# Patient Record
Sex: Male | Born: 2000
Health system: Southern US, Community
[De-identification: ages and names within clinical notes are randomized; demographics above are authoritative.]

## PROBLEM LIST (undated history)

## (undated) DIAGNOSIS — G43909 Migraine, unspecified, not intractable, without status migrainosus: Secondary | ICD-10-CM

## (undated) DIAGNOSIS — J45909 Unspecified asthma, uncomplicated: Secondary | ICD-10-CM

## (undated) HISTORY — PX: CIRCUMCISION: SUR203

---
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
## 2019-01-07 DIAGNOSIS — R809 Proteinuria, unspecified: Secondary | ICD-10-CM | POA: Diagnosis not present

## 2019-01-07 DIAGNOSIS — M549 Dorsalgia, unspecified: Secondary | ICD-10-CM | POA: Diagnosis not present

## 2022-03-21 ENCOUNTER — Emergency Department (HOSPITAL_BASED_OUTPATIENT_CLINIC_OR_DEPARTMENT_OTHER)
Admission: EM | Admit: 2022-03-21 | Discharge: 2022-03-21 | Disposition: A | Discharge status: Home / Self Care | Payer: Managed Care, Other (non HMO) | Attending: Emergency Medicine | Admitting: Emergency Medicine

## 2022-03-21 ENCOUNTER — Encounter (HOSPITAL_BASED_OUTPATIENT_CLINIC_OR_DEPARTMENT_OTHER): Payer: Self-pay | Admitting: Obstetrics and Gynecology

## 2022-03-21 ENCOUNTER — Emergency Department (HOSPITAL_BASED_OUTPATIENT_CLINIC_OR_DEPARTMENT_OTHER): Payer: Managed Care, Other (non HMO) | Admitting: Radiology

## 2022-03-21 ENCOUNTER — Other Ambulatory Visit: Payer: Self-pay

## 2022-03-21 DIAGNOSIS — Y9339 Activity, other involving climbing, rappelling and jumping off: Secondary | ICD-10-CM | POA: Diagnosis not present

## 2022-03-21 DIAGNOSIS — M25562 Pain in left knee: Secondary | ICD-10-CM

## 2022-03-21 DIAGNOSIS — X500XXA Overexertion from strenuous movement or load, initial encounter: Secondary | ICD-10-CM | POA: Insufficient documentation

## 2022-03-21 DIAGNOSIS — S8392XA Sprain of unspecified site of left knee, initial encounter: Secondary | ICD-10-CM | POA: Insufficient documentation

## 2022-03-21 DIAGNOSIS — S8992XA Unspecified injury of left lower leg, initial encounter: Secondary | ICD-10-CM | POA: Diagnosis present

## 2022-03-21 MED ORDER — IBUPROFEN 800 MG PO TABS: 800.0000 mg | ORAL_TABLET | Freq: Three times a day (TID) | ORAL | 0 refills | Status: AC

## 2022-03-21 MED ORDER — KETOROLAC TROMETHAMINE 15 MG/ML IJ SOLN
15.0000 mg | Freq: Once | INTRAMUSCULAR | Status: AC
  Administered 2022-03-21: 15 mg via INTRAMUSCULAR
  Filled 2022-03-21: qty 1

## 2022-03-21 NOTE — ED Triage Notes (Signed)
Patient reports he was fishing yesterday and there was a snake in his kayak and he jumped and fell out of the kayak and his knee twisted. Patient reports the swelling has worsened over the night and he is having increased pain.  ?

## 2022-03-21 NOTE — ED Provider Notes (Signed)
?[facility] EMERGENCY DEPT ?Provider Note ? ? ?CSN: [id number] ?Arrival date & time: 03/21/22  1354 ? ?  ? ?History ?Chief Complaint  ?Patient presents with  ? Knee Injury  ? ? ?Joshua Ward is a 21 y.o. male who presents to the emergency department with left knee pain that started yesterday.  Patient states he was fishing in his kayak when a snake came onto his kayak and he tried to escape jumping off the kayak and felt a twisting sensation on the knee.  He did not think anything of it initially but overnight his knee became increasing more painful and swollen.  Difficulty ambulating secondary to pain.  Denies any other additional injury, fever, chills. ? ?HPI ? ?  ? ?Home Medications ?Prior to Admission medications   ?Medication Sig Start Date End Date Taking? Authorizing Provider  ?ibuprofen (ADVIL) 800 MG tablet Take 1 tablet (800 mg total) by mouth 3 (three) times daily. 03/21/22  Yes Meredeth Ide, Shikara Mcauliffe M, PA-C  ?albuterol (PROVENTIL HFA;VENTOLIN HFA) 108 (90 BASE) MCG/ACT inhaler Inhale 2 puffs into the lungs every 6 (six) hours as needed for wheezing or shortness of breath.    [provider]  ?cyclobenzaprine (FLEXERIL) 10 MG tablet Take 1 tablet (10 mg total) by mouth 3 (three) times daily. 11/11/15   Niel Hummer, MD  ?diphenhydrAMINE (BENADRYL) 25 mg capsule Take 1 capsule (25 mg total) by mouth every 6 (six) hours as needed. 08/17/15   Margurite Auerbach, MD  ?imipramine (TOFRANIL) 25 MG tablet Take 25 mg by mouth at bedtime. 07/11/15   [provider]  ?montelukast (SINGULAIR) 10 MG tablet Take 10 mg by mouth every evening. 07/27/15   [provider]  ?naproxen (NAPROSYN) 500 MG tablet Take 1 tablet (500 mg total) by mouth 2 (two) times daily. 11/11/15   Niel Hummer, MD  ?   ? ?Allergies    ?Amoxicillin, Prednisone, Sulfamethizole, and Tamiflu [oseltamivir phosphate]   ? ?Review of Systems   ?Review of Systems  ?All other systems reviewed and are  negative. ? ?Physical Exam ?Updated Vital Signs ?BP (!) 151/89   Pulse 83   Temp 99.3 ?F (37.4 ?C)   Resp 17   Ht 6\' 2"  (1.88 m)   Wt 72.6 kg   SpO2 100%   BMI 20.54 kg/m?  ?Physical Exam ?Vitals and nursing note reviewed.  ?Constitutional:   ?   Appearance: Normal appearance.  ?HENT:  ?   Head: Normocephalic and atraumatic.  ?Eyes:  ?   General:     ?   Right eye: No discharge.     ?   Left eye: No discharge.  ?   Conjunctiva/sclera: Conjunctivae normal.  ?Pulmonary:  ?   Effort: Pulmonary effort is normal.  ?Musculoskeletal:  ?   Comments: Mild swelling and tenderness over the medial aspect of the left knee.  Valgus and varus testing negative.  He does have limited range of motion with flexion of the knee secondary to pain.  ?Skin: ?   General: Skin is warm and dry.  ?   Findings: No rash.  ?Neurological:  ?   General: No focal deficit present.  ?   Mental Status: He is alert.  ?Psychiatric:     ?   Mood and Affect: Mood normal.     ?   Behavior: Behavior normal.  ? ? ?ED Results / Procedures / Treatments   ?Labs ?(all labs ordered are listed, but only abnormal results are displayed) ?Labs  Reviewed - No data to display ? ?EKG ?None ? ?Radiology ?DG Knee Complete 4 Views Left ? ?Result Date: 03/21/2022 ?CLINICAL DATA:  Left knee injury from jumping out of kayak. Medial left knee pain. EXAM: LEFT KNEE - COMPLETE 4+ VIEW COMPARISON:  None Available. FINDINGS: No evidence of fracture, dislocation, or joint effusion. No evidence of arthropathy or other focal bone abnormality. Soft tissues are unremarkable. IMPRESSION: Negative. Electronically Signed   By: Danae Orleans M.D.   On: 03/21/2022 15:11   ? ?Procedures ?Procedures  ? ? ?Medications Ordered in ED ?Medications  ?ketorolac (TORADOL) 15 MG/ML injection 15 mg (15 mg Intramuscular Given 03/21/22 1607)  ? ? ?ED Course/ Medical Decision Making/ A&P ?  ?                        ?Medical Decision Making ?Joshua Ward is a 21 y.o. male who presents to the ED  for further evaluation of left-sided knee pain.  Eventual diagnosis includes knee sprain with mild meniscal or ligamentous injury.  I have a low suspicion for septic arthritis at this time.  Patient is otherwise young and healthy no acute distress.  We will give him a shot of Toradol for pain in addition to an Ace wrap for comfort.  I will give him a work note for tomorrow and have him follow-up with Ortho for further evaluation.  Strict turn precautions were discussed.  He is safe to discharge ? ? ?Amount and/or Complexity of Data Reviewed ?Radiology: ordered and independent interpretation performed. ?   Details: I ordered and personally interpreted image of the left knee.  No evidence of fractures or dislocations. ? ?Risk ?Prescription drug management. ?Decision regarding hospitalization. ? ? ?Final Clinical Impression(s) / ED Diagnoses ?Final diagnoses:  ?Acute pain of left knee  ? ? ?Rx / DC Orders ?ED Discharge Orders   ? ?      Ordered  ?  ibuprofen (ADVIL) 800 MG tablet  3 times daily       ? 03/21/22 1626  ? ?  ?  ? ?  ? ? ?  ?Teressa Lower, PA-C ?03/21/22 1626 ? ?  ?Ernie Avena, MD ?03/22/22 0118 ? ?

## 2022-03-21 NOTE — ED Notes (Signed)
RN provided AVS using Teachback Method. Patient verbalizes understanding of Discharge Instructions. Opportunity for Questioning and Answers were provided by RN. Patient Discharged from ED ambulatory to Home with Crutches via Self. ? ?

## 2022-03-21 NOTE — Discharge Instructions (Addendum)
Please follow-up with orthopedics or your primary care doctor in 1 week for further evaluation.  Please return to the emergency department for any worsening symptoms you might have. ?

## 2022-08-02 ENCOUNTER — Other Ambulatory Visit: Payer: Self-pay

## 2022-08-02 ENCOUNTER — Emergency Department (HOSPITAL_BASED_OUTPATIENT_CLINIC_OR_DEPARTMENT_OTHER): Payer: Managed Care, Other (non HMO)

## 2022-08-02 ENCOUNTER — Encounter (HOSPITAL_BASED_OUTPATIENT_CLINIC_OR_DEPARTMENT_OTHER): Payer: Self-pay

## 2022-08-02 ENCOUNTER — Emergency Department (HOSPITAL_BASED_OUTPATIENT_CLINIC_OR_DEPARTMENT_OTHER)
Admission: EM | Admit: 2022-08-02 | Discharge: 2022-08-02 | Disposition: A | Discharge status: Home / Self Care | Payer: Managed Care, Other (non HMO) | Attending: Emergency Medicine | Admitting: Emergency Medicine

## 2022-08-02 DIAGNOSIS — R1030 Lower abdominal pain, unspecified: Secondary | ICD-10-CM | POA: Diagnosis present

## 2022-08-02 DIAGNOSIS — K529 Noninfective gastroenteritis and colitis, unspecified: Secondary | ICD-10-CM | POA: Insufficient documentation

## 2022-08-02 LAB — COMPREHENSIVE METABOLIC PANEL
ALT: 12 U/L (ref 0–44)
AST: 17 U/L (ref 15–41)
Albumin: 4.5 g/dL (ref 3.5–5.0)
Alkaline Phosphatase: 63 U/L (ref 38–126)
Anion gap: 9 (ref 5–15)
BUN: 10 mg/dL (ref 6–20)
CO2: 24 mmol/L (ref 22–32)
Calcium: 9.3 mg/dL (ref 8.9–10.3)
Chloride: 106 mmol/L (ref 98–111)
Creatinine, Ser: 0.98 mg/dL (ref 0.61–1.24)
GFR, Estimated: >60 mL/min (ref >60)
Glucose, Bld: 92 mg/dL (ref 70–99)
Potassium: 3.8 mmol/L (ref 3.5–5.1)
Sodium: 139 mmol/L (ref 135–145)
Total Bilirubin: 0.3 mg/dL (ref 0.3–1.2)
Total Protein: 7.8 g/dL (ref 6.5–8.1)

## 2022-08-02 LAB — CBC
HCT: 38.8 % — ABNORMAL LOW (ref 39.0–52.0)
Hemoglobin: 13 g/dL (ref 13.0–17.0)
MCH: 24.3 pg — ABNORMAL LOW (ref 26.0–34.0)
MCHC: 33.5 g/dL (ref 30.0–36.0)
MCV: 72.4 fL — ABNORMAL LOW (ref 80.0–100.0)
Platelets: 191 10*3/uL (ref 150–400)
RBC: 5.36 MIL/uL (ref 4.22–5.81)
RDW: 14.6 % (ref 11.5–15.5)
WBC: 4.6 10*3/uL (ref 4.0–10.5)
nRBC: 0 % (ref 0.0–0.2)

## 2022-08-02 LAB — URINALYSIS, ROUTINE W REFLEX MICROSCOPIC
Bilirubin Urine: NEGATIVE
Glucose, UA: NEGATIVE mg/dL
Hgb urine dipstick: NEGATIVE
Ketones, ur: NEGATIVE mg/dL
Leukocytes,Ua: NEGATIVE
Nitrite: NEGATIVE
Specific Gravity, Urine: 1.021 (ref 1.005–1.030)
pH: 6 (ref 5.0–8.0)

## 2022-08-02 MED ORDER — MORPHINE SULFATE (PF) 2 MG/ML IV SOLN
2.0000 mg | Freq: Once | INTRAVENOUS | Status: AC
  Administered 2022-08-02: 2 mg via INTRAVENOUS
  Filled 2022-08-02: qty 1

## 2022-08-02 MED ORDER — LACTATED RINGERS IV BOLUS
1000.0000 mL | Freq: Once | INTRAVENOUS | Status: AC
Start: 2022-08-02 — End: 2022-08-02
  Administered 2022-08-02: 1000 mL via INTRAVENOUS

## 2022-08-02 MED ORDER — ONDANSETRON HCL 8 MG PO TABS: 8.0000 mg | ORAL_TABLET | Freq: Three times a day (TID) | ORAL | 0 refills | Status: AC | PRN

## 2022-08-02 MED ORDER — IOHEXOL 300 MG/ML  SOLN
100.0000 mL | Freq: Once | INTRAMUSCULAR | Status: AC | PRN
  Administered 2022-08-02: 85 mL via INTRAVENOUS

## 2022-08-02 NOTE — ED Triage Notes (Signed)
Pt states that he has been having lower abd pain since yesterday with n/v/d, denies fevers.

## 2022-08-02 NOTE — Discharge Instructions (Addendum)
Please drink plenty of fluids. A prescription for nausea medicine has been called to your pharmacy There is no evidence of acute intra-abdominal abnormality such as appendicitis.  Please return to the emergency department if you are having ongoing or worsening abdominal pain. Follow-up with your doctor in the next 1 to 2 weeks

## 2022-08-02 NOTE — ED Notes (Signed)
Patient transported to CT 

## 2022-08-02 NOTE — ED Provider Notes (Signed)
[facility] Provider Note   CSN: [id number] Arrival date & time: 08/02/22  [id number]     History  Chief Complaint  Patient presents with   Abdominal Pain    Joshua Ward is a 21 y.o. male.  HPI 21 year old male with past medical history presents today complaining of crampy lower abdominal pain.  He states it began yesterday.  He had multiple episodes of loose stool.  He did not note any blood or mucus.  He had 1 episode of vomiting.  He reports that a family member had some similar symptoms.  It is in his lower abdomen and it is crampy.  He has had no fever or chills.  He has not had any vomiting or diarrhea today.     Home Medications Prior to Admission medications   Medication Sig Start Date End Date Taking? Authorizing Provider  ondansetron (ZOFRAN) 8 MG tablet Take 1 tablet (8 mg total) by mouth every 8 (eight) hours as needed for nausea or vomiting. 08/02/22  Yes Pattricia Boss, MD  albuterol (PROVENTIL HFA;VENTOLIN HFA) 108 (90 BASE) MCG/ACT inhaler Inhale 2 puffs into the lungs every 6 (six) hours as needed for wheezing or shortness of breath.    [provider]  cyclobenzaprine (FLEXERIL) 10 MG tablet Take 1 tablet (10 mg total) by mouth 3 (three) times daily. 11/11/15   Louanne Skye, MD  diphenhydrAMINE (BENADRYL) 25 mg capsule Take 1 capsule (25 mg total) by mouth every 6 (six) hours as needed. 08/17/15   Rocky Link, MD  ibuprofen (ADVIL) 800 MG tablet Take 1 tablet (800 mg total) by mouth 3 (three) times daily. 03/21/22   Myna Bright M, PA-C  imipramine (TOFRANIL) 25 MG tablet Take 25 mg by mouth at bedtime. 07/11/15   [provider]  montelukast (SINGULAIR) 10 MG tablet Take 10 mg by mouth every evening. 07/27/15   [provider]  naproxen (NAPROSYN) 500 MG tablet Take 1 tablet (500 mg total) by mouth 2 (two) times daily. 11/11/15   Louanne Skye, MD      Allergies    Amoxicillin, Prednisone, Sulfamethizole,  and Tamiflu [oseltamivir phosphate]    Review of Systems   Review of Systems  Physical Exam Updated Vital Signs BP (!) 123/105   Pulse (!) 58   Temp 97.9 F (36.6 C) (Oral)   Resp 16   Ht 1.88 m (6\' 2" )   Wt 72.6 kg   SpO2 100%   BMI 20.55 kg/m  Physical Exam Vitals and nursing note reviewed.  Constitutional:      Appearance: He is well-developed.  HENT:     Head: Normocephalic and atraumatic.     Right Ear: External ear normal.     Left Ear: External ear normal.     Nose: Nose normal.  Eyes:     Extraocular Movements: Extraocular movements intact.  Neck:     Trachea: No tracheal deviation.  Pulmonary:     Effort: Pulmonary effort is normal.  Abdominal:     General: Abdomen is flat. Bowel sounds are normal.     Palpations: Abdomen is soft.     Tenderness: There is abdominal tenderness in the right lower quadrant, suprapubic area and left lower quadrant.  Musculoskeletal:        General: Normal range of motion.  Skin:    General: Skin is warm and dry.  Neurological:     Mental Status: He is alert and oriented to person, place, and time.  Psychiatric:        Mood and Affect: Mood normal.        Behavior: Behavior normal.     ED Results / Procedures / Treatments   Labs (all labs ordered are listed, but only abnormal results are displayed) Labs Reviewed  CBC - Abnormal; Notable for the following components:      Result Value   HCT 38.8 (*)    MCV 72.4 (*)    MCH 24.3 (*)    All other components within normal limits  URINALYSIS, ROUTINE W REFLEX MICROSCOPIC - Abnormal; Notable for the following components:   Protein, ur TRACE (*)    All other components within normal limits  COMPREHENSIVE METABOLIC PANEL    EKG None  Radiology CT ABDOMEN PELVIS W CONTRAST  Result Date: 08/02/2022 CLINICAL DATA:  21 year old male with left lower quadrant abdominal pain since yesterday. EXAM: CT ABDOMEN AND PELVIS WITH CONTRAST TECHNIQUE: Multidetector CT imaging of the  abdomen and pelvis was performed using the standard protocol following bolus administration of intravenous contrast. RADIATION DOSE REDUCTION: This exam was performed according to the departmental dose-optimization program which includes automated exposure control, adjustment of the mA and/or kV according to patient size and/or use of iterative reconstruction technique. CONTRAST:  21mL OMNIPAQUE IOHEXOL 300 MG/ML  SOLN COMPARISON:  CT Abdomen and Pelvis 12/27/2015. FINDINGS: Lower chest: Negative. Hepatobiliary: Negative liver and gallbladder. Pancreas: Negative. Spleen: Negative. Adrenals/Urinary Tract: Normal adrenal glands. Renal enhancement appears symmetric and normal. No nephrolithiasis or pararenal inflammation identified. No hydronephrosis or hydroureter. 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 Diminutive and unremarkable urinary bladder with several bilateral pelvic phleboliths. Stomach/Bowel: Negative rectum, and negative descending and sigmoid colon segments except for some retained stool. No diverticulosis or active inflammation identified. Similar retained stool in the transverse colon. Negative right colon. Normal appendix on series 2, image 64. Decompressed terminal ileum. No dilated small bowel. Unremarkable stomach. No free air or free fluid. Vascular/Lymphatic: Suboptimal intravascular contrast bolus but the major vascular structures in the abdomen and pelvis appear patent. No calcified atherosclerosis or lymphadenopathy identified. Reproductive: Negative. Other: No pelvic free fluid. Hemipelvis phleboliths on the left series 2, image 78, and right images 72 and 79. Musculoskeletal: Negative. IMPRESSION: 1. No acute or inflammatory process identified in the abdomen or pelvis. Normal appendix. 2. 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 Electronically Signed   By: Genevie Ann M.D.   On: 08/02/2022 08:49     Procedures Procedures    Medications Ordered in ED Medications  lactated ringers bolus 1,000 mL (1,000 mLs Intravenous New Bag/Given 08/02/22 0737)  morphine (PF) 2 MG/ML injection 2 mg (2 mg Intravenous Given 08/02/22 0738)  iohexol (OMNIPAQUE) 300 MG/ML solution 100 mL (85 mLs Intravenous Contrast Given 08/02/22 0835)    ED Course/ Medical Decision Making/ A&P Clinical Course as of 08/02/22 [id number]  Tue Aug 02, 2022  0859 CT ABDOMEN PELVIS W CONTRAST CT reviewed and personally interpreted with no evidence of acute abnormality and radiologist interpretation concurs [DR]  0901 Comprehensive metabolic panel Complete metabolic panel reviewed interpreted and normal [DR]  0901 Urinalysis normal with the exception of trace protein noted [DR]  0901 CBC reviewed and interpreted with hemoglobin of 13 and platelets 191,000.  The white blood cell count has not been reported.  I called over to the lab and they stated that it was 4900.  However there was some abnormality and the smear was indicated.  This  has not resulted at this time. [DR]    Clinical Course User Index [DR] Pattricia Boss, MD                           Medical Decision Making Amount and/or Complexity of Data Reviewed Labs: ordered. Radiology: ordered.  Risk Prescription drug management.           Final Clinical Impression(s) / ED Diagnoses Final diagnoses:  Lower abdominal pain  Gastroenteritis    Rx / DC Orders ED Discharge Orders          Ordered    ondansetron (ZOFRAN) 8 MG tablet  Every 8 hours PRN        08/02/22 0903              Pattricia Boss, MD 08/02/22 [phone]

## 2023-07-08 IMAGING — DX DG KNEE COMPLETE 4+V*L*
4 series · 4 of 4 positions shown · non-contrast
Comparison: None Available.

CLINICAL DATA: Left knee injury from jumping out of kayak. Medial
left knee pain.

EXAM:
LEFT KNEE - COMPLETE 4+ VIEW

[knee ap]
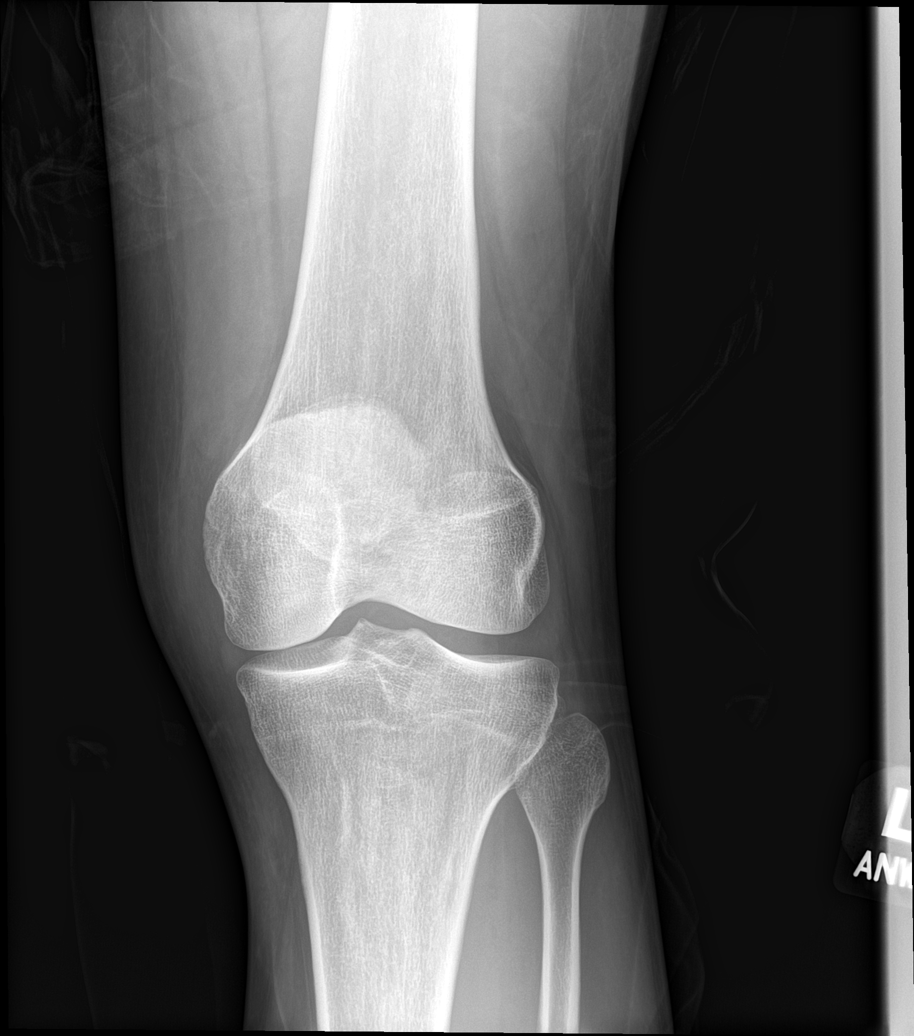

[knee obl (1 of 2)]
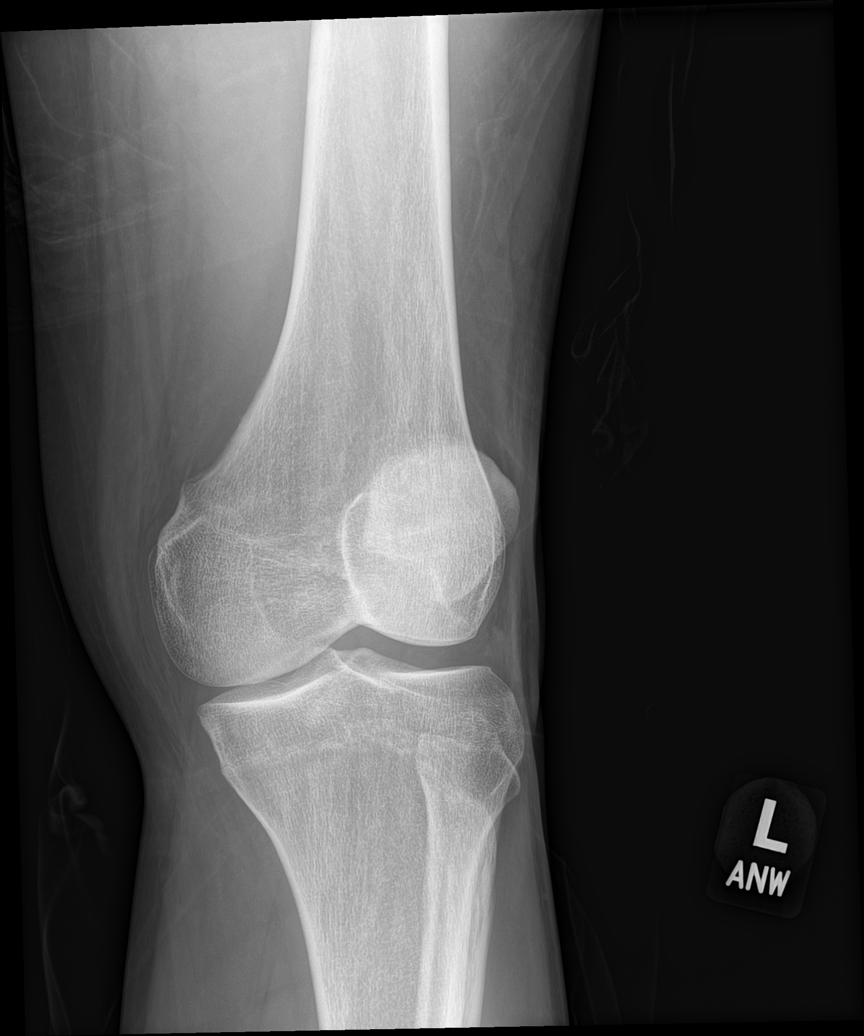

[knee obl (2 of 2)]
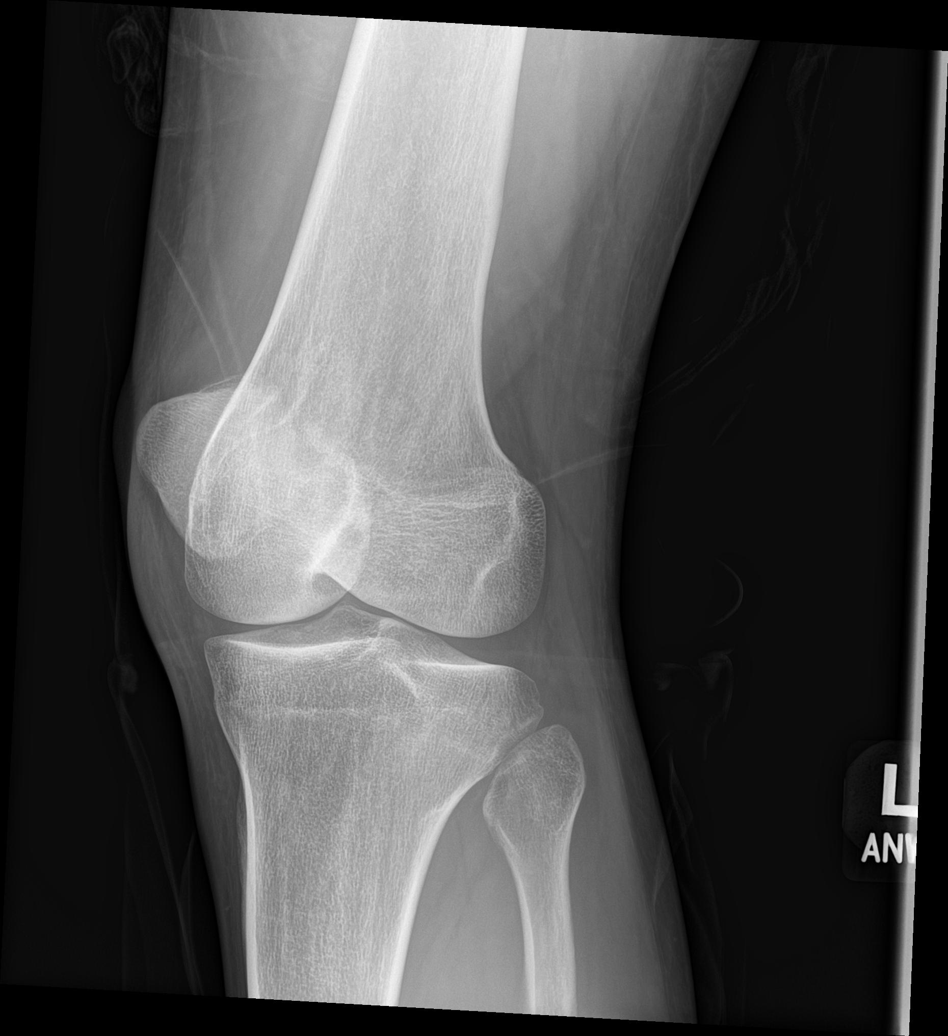

[knee lat]
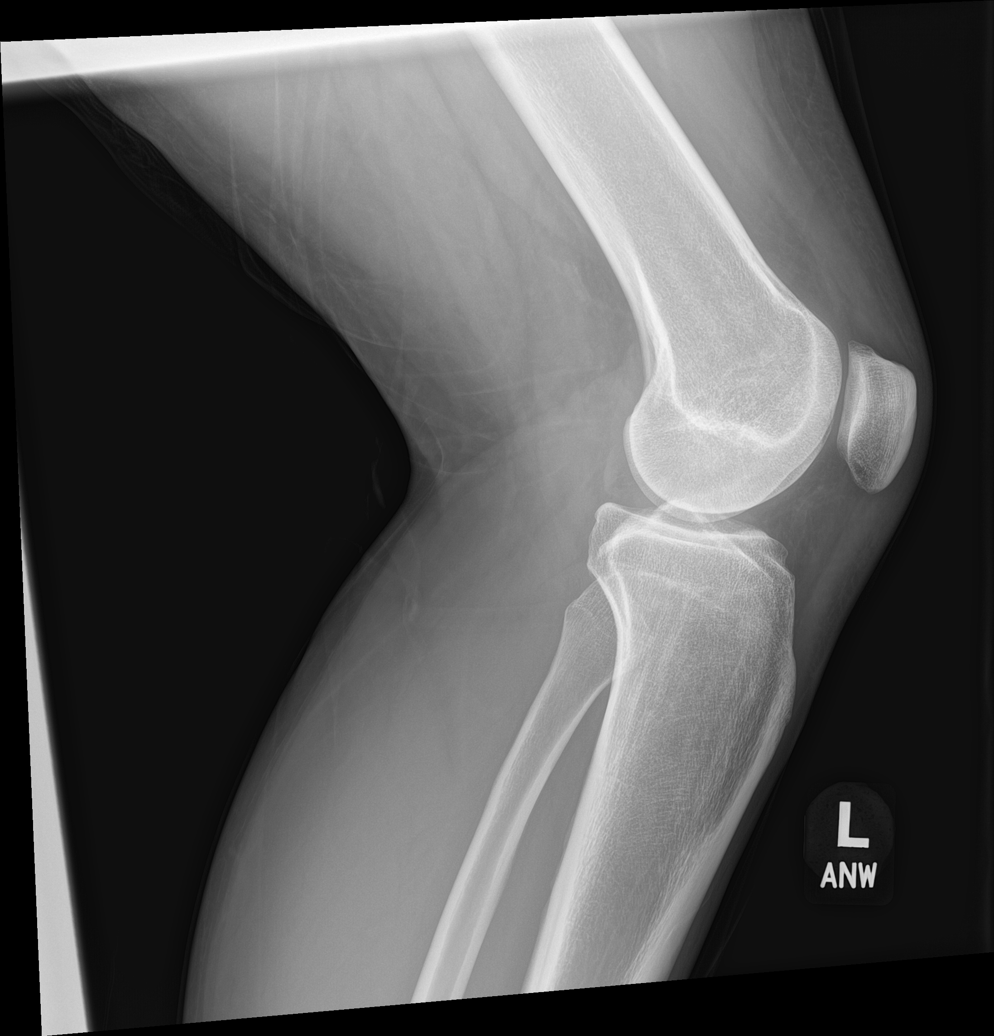

[4 of 4 positions shown; findings below may reference images not displayed]

FINDINGS: No evidence of fracture, dislocation, or joint effusion. No evidence
of arthropathy or other focal bone abnormality. Soft tissues are
unremarkable.
IMPRESSION: Negative.
